# Patient Record
Sex: Male | Born: 1990 | Race: Black or African American | Hispanic: No | Marital: Single | Smoking: Never smoker
Health system: Southern US, Community
[De-identification: ages and names within clinical notes are randomized; demographics above are authoritative.]

## PROBLEM LIST (undated history)

## (undated) DIAGNOSIS — I10 Essential (primary) hypertension: Secondary | ICD-10-CM

## (undated) HISTORY — PX: ROTATOR CUFF REPAIR: SHX139

## (undated) HISTORY — DX: Essential (primary) hypertension: I10

---
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
## 2013-05-03 ENCOUNTER — Ambulatory Visit (INDEPENDENT_AMBULATORY_CARE_PROVIDER_SITE_OTHER): Payer: 59 | Admitting: Emergency Medicine

## 2013-05-03 VITALS — BP 152/88 | HR 57 | Temp 98.1°F | Resp 18 | Ht 72.0 in | Wt 257.0 lb

## 2013-05-03 DIAGNOSIS — L251 Unspecified contact dermatitis due to drugs in contact with skin: Secondary | ICD-10-CM

## 2013-05-03 MED ORDER — TRIAMCINOLONE ACETONIDE 0.1 % EX CREA: TOPICAL_CREAM | Freq: Two times a day (BID) | CUTANEOUS | Status: DC
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
## 2013-05-03 NOTE — Patient Instructions (Signed)

## 2015-12-29 ENCOUNTER — Emergency Department (HOSPITAL_BASED_OUTPATIENT_CLINIC_OR_DEPARTMENT_OTHER)
Admission: EM | Admit: 2015-12-29 | Discharge: 2015-12-29 | Disposition: A | Discharge status: Home / Self Care | Payer: 59 | Attending: Emergency Medicine | Admitting: Emergency Medicine

## 2015-12-29 ENCOUNTER — Encounter (HOSPITAL_BASED_OUTPATIENT_CLINIC_OR_DEPARTMENT_OTHER): Payer: Self-pay | Admitting: Emergency Medicine

## 2015-12-29 DIAGNOSIS — R1010 Upper abdominal pain, unspecified: Secondary | ICD-10-CM | POA: Diagnosis not present

## 2015-12-29 DIAGNOSIS — R197 Diarrhea, unspecified: Secondary | ICD-10-CM | POA: Insufficient documentation

## 2015-12-29 DIAGNOSIS — R112 Nausea with vomiting, unspecified: Secondary | ICD-10-CM | POA: Insufficient documentation

## 2015-12-29 DIAGNOSIS — Z7952 Long term (current) use of systemic steroids: Secondary | ICD-10-CM | POA: Insufficient documentation

## 2015-12-29 MED ORDER — CEFTRIAXONE SODIUM 250 MG IJ SOLR: 250.0000 mg | Freq: Once | INTRAMUSCULAR | Status: DC

## 2015-12-29 MED ORDER — AZITHROMYCIN 250 MG PO TABS: 1000.0000 mg | ORAL_TABLET | Freq: Once | ORAL | Status: DC

## 2015-12-29 MED ORDER — ONDANSETRON HCL 4 MG/2ML IJ SOLN
4.0000 mg | Freq: Once | INTRAMUSCULAR | Status: AC
  Administered 2015-12-29: 4 mg via INTRAVENOUS
  Filled 2015-12-29: qty 2

## 2015-12-29 NOTE — Discharge Instructions (Signed)
Food Poisoning °Food poisoning is an illness caused by something you ate or drank. There are over 250 known causes of food poisoning. However, many other causes are unknown. You can be treated even if the exact cause of your food poisoning is not known. In most cases, food poisoning is mild and lasts 1 to 2 days. However, some cases can be serious, especially for people with low immune systems, the elderly, children and infants, and pregnant women. °CAUSES  °Poor personal hygiene, improper cleaning of storage and preparation areas, and unclean utensils can cause infection or tainting (contamination) of foods. The causes of food poisoning are numerous. Infectious agents, such as viruses, bacteria, or parasites, can cause harm by infecting the intestine and disrupting the absorption of nutrients and water. This can cause diarrhea and lead to dehydration. Viruses are responsible for most of the food poisonings in which an agent is found. Parasites are less likely to cause food poisoning. Toxic agents, such as poisonous mushrooms, marine algae, and pesticides can also cause food poisoning. °· Viral causes of food poisoning include: °¨ Norovirus. °¨ Rotavirus. °¨ Hepatitis A. °· Bacterial causes of food poisoning include: °¨ Salmonellae. °¨ Campylobacter. °¨ Bacillus cereus. °¨ Escherichia coli (E. coli). °¨ Shigella. °¨ Listeria monocytogenes. °¨ Clostridium botulinum (botulism). °¨ Vibrio cholerae. °· Parasites that can cause food poisoning include: °¨ Giardia. °¨ Cryptosporidium. °¨ Toxoplasma. °SYMPTOMS °Symptoms may appear several hours or longer after consuming the contaminated food or drink. Symptoms may include: °· Nausea. °· Vomiting. °· Cramping. °· Diarrhea. °· Fever and chills. °· Muscle aches. °DIAGNOSIS °Your health care provider may be able to diagnose food poisoning from a list of what you have recently eaten and results from lab tests. Diagnostic tests may include an exam of the feces. °TREATMENT °In  most cases, treatment focuses on helping to relieve your symptoms and staying well hydrated. Antibiotic medicines are rarely needed. In severe cases, hospitalization may be required. °HOME CARE INSTRUCTIONS  °· Drink enough water and fluids to keep your urine clear or pale yellow. Drink small amounts of fluids frequently and increase as tolerated. °· Ask your health care provider for specific rehydration instructions. °· Avoid: °¨ Foods high in sugar. °¨ Alcohol. °¨ Carbonated drinks. °¨ Tobacco. °¨ Juice. °¨ Caffeine drinks. °¨ Extremely hot or cold fluids. °¨ Fatty, greasy foods. °¨ Too much intake of anything at one time. °¨ Dairy products until 24 to 48 hours after diarrhea stops. °· You may consume probiotics. Probiotics are active cultures of beneficial bacteria. They may lessen the amount and number of diarrheal stools in adults. Probiotics can be found in yogurt with active cultures and in supplements. °· Wash your hands well to avoid spreading the bacteria. °· Take medicines only as directed by your health care provider. Do not give your child aspirin because of the association with Reye's syndrome. °· Ask your health care provider if you should continue to take your regular prescribed and over-the-counter medicines. °PREVENTION  °· Wash your hands, food preparation surfaces, and utensils thoroughly before and after handling raw foods. °· Keep refrigerated foods below 40°F (5°C). °· Serve hot foods immediately or keep them heated above 140°F (60°C). °· Divide large volumes of food into small portions for rapid cooling in the refrigerator. Hot, bulky foods in the refrigerator can raise the temperature of other foods that have already cooled. °· Follow approved canning procedures. °· Heat canned foods thoroughly before tasting. °· When in doubt, throw it out. °· Infants, the elderly, women   who are pregnant, and people with compromised immune systems are especially susceptible to food poisoning. These people  should never consume unpasteurized cheese, unpasteurized cider, raw fish, raw seafood, or raw meat-type products. °SEEK IMMEDIATE MEDICAL CARE IF:  °· You have difficulty breathing, swallowing, talking, or moving. °· You develop blurred vision. °· You are unable to keep fluids down. °· You faint or nearly faint. °· Your eyes turn yellow. °· Vomiting or diarrhea develops or becomes persistent. °· Abdominal pain develops, increases, or localizes in one small area. °· You have a fever. °· The diarrhea becomes excessive or contains blood or mucus. °· You develop excessive weakness, dizziness, or extreme thirst. °· You have no urine for 8 hours. °MAKE SURE YOU:  °· Understand these instructions. °· Will watch your condition. °· Will get help right away if you are not doing well or get worse. °  °This information is not intended to replace advice given to you by your health care provider. Make sure you discuss any questions you have with your health care provider. °  °Document Released: 06/20/2004 Document Revised: 10/13/2014 Document Reviewed: 03/26/2015 °Elsevier Interactive Patient Education ©2016 [employer] ° °

## 2015-12-29 NOTE — ED Notes (Signed)
Pt awoke around 2am this morning nauseous and vomiting.  Diarrhea started in last hour.

## 2015-12-29 NOTE — ED Notes (Signed)
Last PO fluids approx 1 hour ago

## 2015-12-29 NOTE — ED Provider Notes (Signed)
CSN: [id number]     Arrival date & time 12/29/15  1225 History   First MD Initiated Contact with Patient 12/29/15 1236     Chief Complaint  Patient presents with  . Emesis  . Diarrhea     (Consider location/radiation/quality/duration/timing/severity/associated sxs/prior Treatment) Patient is a 25 y.o. male presenting with vomiting and diarrhea. The history is provided by the patient.  Emesis Severity:  Moderate Duration:  8 days Timing:  Constant Number of daily episodes:  10 Progression:  Unchanged Chronicity:  New Recent urination:  Normal Relieved by:  Nothing Worsened by:  Nothing tried Ineffective treatments:  None tried Associated symptoms: abdominal pain (upper) and diarrhea   Associated symptoms: no fever   Risk factors: no alcohol use   Diarrhea Severity:  Moderate Onset quality:  Gradual Number of episodes:  8-9 x today Timing:  Constant Progression:  Unchanged Relieved by:  Nothing Worsened by:  Nothing tried Ineffective treatments:  None tried Associated symptoms: abdominal pain (upper) and vomiting   Risk factors: suspect food intake (red robin red burger last night)     History reviewed. No pertinent past medical history. Past Surgical History  Procedure Laterality Date  . Rotator cuff repair     Family History  Problem Relation Age of Onset  . Diabetes Mother   . Hypertension Mother   . Diabetes Father   . Hypertension Father    Social History  Substance Use Topics  . Smoking status: Never Smoker   . Smokeless tobacco: None  . Alcohol Use: Yes    Review of Systems  Gastrointestinal: Positive for vomiting, abdominal pain (upper) and diarrhea.  All other systems reviewed and are negative.     Allergies  Review of patient's allergies indicates no known allergies.  Home Medications   Prior to Admission medications   Medication Sig Start Date End Date Taking? Authorizing Provider  triamcinolone cream (KENALOG) 0.1 % Apply topically 2  (two) times daily. 05/03/13   Carmelina Dane, MD   BP 148/78 mmHg  Pulse 114  Temp(Src) 98.1 F (36.7 C) (Oral)  Resp 22  Ht  (1.854 m)  Wt 245 lb (111.131 kg)  BMI 32.33 kg/m2  SpO2 100% Physical Exam  Constitutional: He is oriented to person, place, and time. He appears well-developed and well-nourished. No distress.  HENT:  Head: Normocephalic and atraumatic.  Eyes: Conjunctivae are normal.  Neck: Neck supple. No tracheal deviation present.  Cardiovascular: Normal rate, regular rhythm and normal heart sounds.   Pulmonary/Chest: Effort normal and breath sounds normal. No respiratory distress.  Abdominal: Soft. He exhibits no distension. There is no tenderness. There is no rebound and no guarding.  Neurological: He is alert and oriented to person, place, and time.  Skin: Skin is warm and dry.  Psychiatric: He has a normal mood and affect.  Vitals reviewed.   ED Course  Procedures (including critical care time) Labs Review Labs Reviewed - No data to display  Imaging Review No results found. I have personally reviewed and evaluated these images and lab results as part of my medical decision-making.   EKG Interpretation None      MDM   Final diagnoses:  Nausea vomiting and diarrhea    Patient presents with symptoms c/w a viral gastroenteritis versus food-borne illness (vomiting, diarrhea) for 1 day. No fevers. Patient appears well. No signs of toxicity, patient is interactive. Not in distress. No signs of clinical dehydration. Doubt appendicitis, and no evidence of any other illness.  No imaging or labs indicated with likely self-limited illness. Discussed symptomatic treatment and they will follow closely with their PCP.     Lyndal Pulleyaniel Donnald Tabar, MD 12/30/15 [phone]

## 2015-12-29 NOTE — ED Notes (Signed)
Presents with N/V and abd cramping since approx 0200hrs this am

## 2015-12-29 NOTE — ED Notes (Signed)
[facility]

## 2016-12-16 ENCOUNTER — Encounter (HOSPITAL_COMMUNITY): Payer: Self-pay

## 2016-12-16 ENCOUNTER — Emergency Department (HOSPITAL_COMMUNITY)
Admission: EM | Admit: 2016-12-16 | Discharge: 2016-12-16 | Disposition: A | Discharge status: Home / Self Care | Payer: Worker's Compensation | Attending: Emergency Medicine | Admitting: Emergency Medicine

## 2016-12-16 DIAGNOSIS — Y999 Unspecified external cause status: Secondary | ICD-10-CM | POA: Insufficient documentation

## 2016-12-16 DIAGNOSIS — M545 Low back pain, unspecified: Secondary | ICD-10-CM

## 2016-12-16 DIAGNOSIS — Y92410 Unspecified street and highway as the place of occurrence of the external cause: Secondary | ICD-10-CM | POA: Insufficient documentation

## 2016-12-16 DIAGNOSIS — Y9389 Activity, other specified: Secondary | ICD-10-CM | POA: Insufficient documentation

## 2016-12-16 DIAGNOSIS — M549 Dorsalgia, unspecified: Secondary | ICD-10-CM | POA: Diagnosis not present

## 2016-12-16 DIAGNOSIS — M542 Cervicalgia: Secondary | ICD-10-CM

## 2016-12-16 MED ORDER — METHOCARBAMOL 500 MG PO TABS: 500.0000 mg | ORAL_TABLET | Freq: Every evening | ORAL | 0 refills | Status: DC | PRN

## 2016-12-16 NOTE — ED Notes (Signed)
Declined W/C at D/C and was escorted to lobby by RN. 
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
## 2016-12-16 NOTE — Discharge Instructions (Signed)
Take Ibuprofen three times daily for the next week. Take this medicine with food. °Take muscle relaxer at bedtime to help you sleep. This medicine makes you drowsy so do not take before driving or work °Use a heating pad for sore muscles - use for 20 minutes several times a day °Return for worsening symptoms ° °
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
## 2016-12-23 ENCOUNTER — Ambulatory Visit: Payer: Self-pay

## 2016-12-23 ENCOUNTER — Other Ambulatory Visit: Payer: Self-pay | Admitting: Occupational Medicine

## 2016-12-23 DIAGNOSIS — M542 Cervicalgia: Secondary | ICD-10-CM

## 2016-12-23 DIAGNOSIS — M545 Low back pain: Secondary | ICD-10-CM
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
## 2018-12-23 ENCOUNTER — Other Ambulatory Visit: Payer: Self-pay

## 2018-12-23 ENCOUNTER — Encounter: Payer: Self-pay | Admitting: Podiatry

## 2018-12-23 ENCOUNTER — Ambulatory Visit: Payer: BC Managed Care – PPO | Admitting: Podiatry

## 2018-12-23 VITALS — BP 167/106

## 2018-12-23 DIAGNOSIS — L600 Ingrowing nail: Secondary | ICD-10-CM

## 2018-12-23 MED ORDER — NEOMYCIN-POLYMYXIN-HC 3.5-10000-1 OT SOLN: OTIC | 0 refills | Status: DC
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
## 2018-12-23 NOTE — Patient Instructions (Signed)

## 2019-01-23 ENCOUNTER — Encounter (HOSPITAL_COMMUNITY): Payer: Self-pay | Admitting: Urgent Care

## 2019-01-23 ENCOUNTER — Other Ambulatory Visit: Payer: Self-pay

## 2019-01-23 ENCOUNTER — Ambulatory Visit (HOSPITAL_COMMUNITY)
Admission: EM | Admit: 2019-01-23 | Discharge: 2019-01-23 | Disposition: A | Discharge status: Home / Self Care | Payer: BC Managed Care – PPO | Attending: Urgent Care | Admitting: Urgent Care

## 2019-01-23 DIAGNOSIS — M545 Low back pain, unspecified: Secondary | ICD-10-CM

## 2019-01-23 DIAGNOSIS — S161XXA Strain of muscle, fascia and tendon at neck level, initial encounter: Secondary | ICD-10-CM | POA: Diagnosis not present

## 2019-01-23 DIAGNOSIS — M542 Cervicalgia: Secondary | ICD-10-CM | POA: Diagnosis not present

## 2019-01-23 MED ORDER — CYCLOBENZAPRINE HCL 5 MG PO TABS: 5.0000 mg | ORAL_TABLET | Freq: Three times a day (TID) | ORAL | 1 refills | Status: DC | PRN

## 2019-01-23 MED ORDER — MELOXICAM 15 MG PO TABS: 15.0000 mg | ORAL_TABLET | Freq: Every day | ORAL | 0 refills | Status: DC
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
## 2019-01-23 NOTE — Discharge Instructions (Signed)
Take 2 tablets of your amlodipine once daily to help with your blood pressure especially while taking the medications that I will prescribe. Use meloxicam at a 1/2 tablet once daily to start with but if your neck and low back pain persist, go ahead and take the full tablet. Use Flexeril (cyclobenzaprine) as a muscle relaxant, 3 times a day as you need it. Or if it makes you sleepy, then take 1-2 tablets at bedtime. If your employer has restrictions for medications that can cause drowsiness, then only take Flexeril at bed time.
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
## 2020-06-21 ENCOUNTER — Other Ambulatory Visit: Payer: Self-pay

## 2020-06-21 ENCOUNTER — Emergency Department (HOSPITAL_COMMUNITY)
Admission: EM | Admit: 2020-06-21 | Discharge: 2020-06-21 | Disposition: A | Discharge status: Home / Self Care | Payer: No Typology Code available for payment source | Attending: Emergency Medicine | Admitting: Emergency Medicine

## 2020-06-21 ENCOUNTER — Encounter (HOSPITAL_COMMUNITY): Payer: Self-pay

## 2020-06-21 DIAGNOSIS — Z79899 Other long term (current) drug therapy: Secondary | ICD-10-CM | POA: Insufficient documentation

## 2020-06-21 DIAGNOSIS — M25512 Pain in left shoulder: Secondary | ICD-10-CM | POA: Insufficient documentation

## 2020-06-21 DIAGNOSIS — M25511 Pain in right shoulder: Secondary | ICD-10-CM | POA: Diagnosis not present

## 2020-06-21 DIAGNOSIS — M62830 Muscle spasm of back: Secondary | ICD-10-CM | POA: Insufficient documentation

## 2020-06-21 DIAGNOSIS — M62838 Other muscle spasm: Secondary | ICD-10-CM

## 2020-06-21 DIAGNOSIS — I10 Essential (primary) hypertension: Secondary | ICD-10-CM | POA: Insufficient documentation

## 2020-06-21 DIAGNOSIS — M545 Low back pain: Secondary | ICD-10-CM | POA: Diagnosis present

## 2020-06-21 DIAGNOSIS — M542 Cervicalgia: Secondary | ICD-10-CM | POA: Diagnosis not present

## 2020-06-21 MED ORDER — IBUPROFEN 600 MG PO TABS
600.0000 mg | ORAL_TABLET | Freq: Four times a day (QID) | ORAL | 0 refills | Status: AC | PRN
Start: 2020-06-21 — End: ?

## 2020-06-21 MED ORDER — CYCLOBENZAPRINE HCL 10 MG PO TABS: 10.0000 mg | ORAL_TABLET | Freq: Two times a day (BID) | ORAL | 0 refills | Status: DC | PRN

## 2020-06-21 NOTE — Discharge Instructions (Signed)
Please follow up with your doctor's office on Monday.
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
## 2020-09-02 ENCOUNTER — Encounter: Payer: Self-pay | Admitting: Cardiovascular Disease

## 2020-09-02 NOTE — Progress Notes (Signed)
Cardiology Office Note:   Date:  09/04/2020  NAME:  Kurt Keller    MRN: [id number] DOB:  [birth year]   PCP:  Kirby Funk, MD  Cardiologist:  No primary care provider on file.  Electrophysiologist:  None   Referring MD: Kirby Funk, MD   Chief Complaint  Patient presents with  . Hypertension   History of Present Illness:   Kurt Keller is a 29 y.o. male with a hx of hypertension who is being seen today for the evaluation of abnormal EKG at the request of Kirby Funk, MD.  Reports that he has been treated for hypertension by his primary care physician.  He takes amlodipine and valsartan.  Blood pressure 122/82.  He is obese with a BMI of 37.  He is starting exercise more work with a Psychologist, educational.  He is also working on his diet as well.  His EKG today demonstrates normal sinus rhythm with inferolateral T wave inversions.  He reports that he had an echocardiogram that was abnormal at his primary care physician's office.  I did review the report of the echocardiogram.  This shows normal chamber size and dimensions.  He had normal left ventricular function.  They report possible left ventricular diastolic dysfunction.  On the report there is no mention of mitral valve tissue Dopplers.  He had a normal E/A ratio given his age.  I do not understand what possible diastolic dysfunction means from an accredited echocardiogram laboratory.  They should have made a definitive statement on this.  Despite this, he reports no symptoms of chest pain or shortness of breath.  He has no symptoms of heart failure.  He denies any chest pain or shortness of breath with his current level of activity.  He completes 20 to 30 minutes of physical activity daily without any limitations.  He works as a Corporate treasurer.  He does not smoke.  He rarely drinks alcohol.  No drug use reported.  There is no family history of sudden cardiac death.  He has had heart disease in his grandparents in their older years.  He  reports no major symptoms today in office.  Past Medical History: Past Medical History:  Diagnosis Date  . Hypertension    Past Surgical History: Past Surgical History:  Procedure Laterality Date  . ROTATOR CUFF REPAIR      Current Medications: Current Meds  Medication Sig  . amLODipine-valsartan (EXFORGE) 10-320 MG tablet Take 1 tablet by mouth daily.  . [DISCONTINUED] amLODipine (NORVASC) 5 MG tablet TK 1 T PO QD  . [DISCONTINUED] cyclobenzaprine (FLEXERIL) 10 MG tablet Take 1 tablet (10 mg total) by mouth 2 (two) times daily as needed for up to 15 doses for muscle spasms.  . [DISCONTINUED] cyclobenzaprine (FLEXERIL) 5 MG tablet Take 1 tablet (5 mg total) by mouth 3 (three) times daily as needed for muscle spasms.  . [DISCONTINUED] meloxicam (MOBIC) 15 MG tablet Take 1 tablet (15 mg total) by mouth daily.  . [DISCONTINUED] triamcinolone cream (KENALOG) 0.1 % Apply topically 2 (two) times daily.     Allergies:    Patient has no known allergies.   Social History: Social History   Socioeconomic History  . Marital status: Single    Spouse name: Not on file  . Number of children: 1  . Years of education: Not on file  . Highest education level: Not on file  Occupational History  . 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 Smoking status: Never Smoker  .  Smokeless tobacco: Never Used  Vaping Use  . Vaping Use: Never used  Substance and Sexual Activity  . Alcohol use: Yes  . Drug use: No  . Sexual activity: Not on file  Other Topics Concern  . Not on file  Social History Narrative  . Not on file   Social Determinants of Health   Financial Resource Strain:   . Difficulty of Paying Living Expenses: Not on file  Food Insecurity:   . Worried About Programme researcher, broadcasting/film/video in the Last Year: Not on file  . Ran Out of Food in the Last Year: Not on file  Transportation Needs:   . Lack of Transportation (Medical): Not on file  . Lack of Transportation (Non-Medical):  Not on file  Physical Activity:   . Days of Exercise per Week: Not on file  . Minutes of Exercise per Session: Not on file  Stress:   . Feeling of Stress : Not on file  Social Connections:   . Frequency of Communication with Friends and Family: Not on file  . Frequency of Social Gatherings with Friends and Family: Not on file  . Attends Religious Services: Not on file  . Active Member of Clubs or Organizations: Not on file  . Attends Banker Meetings: Not on file  . Marital Status: Not on file    Family History: The patient's family history includes Diabetes in his father and mother; Heart attack in his maternal grandfather; Hypertension in his father and mother.  ROS:   All other ROS reviewed and negative. Pertinent positives noted in the HPI.     EKGs/Labs/Other Studies Reviewed:   The following studies were personally reviewed by me today:  EKG:  EKG is ordered today.  The ekg ordered today demonstrates sinus bradycardia, heart rate 54, inferolateral T wave inversions noted, and was personally reviewed by me.   Recent Labs: No results found for requested labs within last 8760 hours.   Recent Lipid Panel No results found for: CHOL, TRIG, HDL, CHOLHDL, VLDL, LDLCALC, LDLDIRECT  Physical Exam:   VS:  BP 124/82   Pulse (!) 54   Ht 6\' 1"  (1.854 m)   Wt 278 lb (126.1 kg)   SpO2 99%   BMI 36.68 kg/m    Wt Readings from Last 3 Encounters:  09/04/20 278 lb (126.1 kg)  06/21/20 270 lb (122.5 kg)  12/29/15 245 lb (111.1 kg)    General: Well nourished, well developed, in no acute distress Heart: Atraumatic, normal size  Eyes: PEERLA, EOMI  Neck: Supple, no JVD Endocrine: No thryomegaly Cardiac: Normal S1, S2; RRR; no murmurs, rubs, or gallops Lungs: Clear to auscultation bilaterally, no wheezing, rhonchi or rales  Abd: Soft, nontender, no hepatomegaly  Ext: No edema, pulses 2+ Musculoskeletal: No deformities, BUE and BLE strength normal and equal Skin: Warm  and dry, no rashes   Neuro: Alert and oriented to person, place, time, and situation, CNII-XII grossly intact, no focal deficits  Psych: Normal mood and affect   ASSESSMENT:   Kurt Keller is a 29 y.o. male who presents for the following: 1. Nonspecific abnormal electrocardiogram (ECG) (EKG)   2. Primary hypertension   3. Obesity (BMI 30-39.9)     PLAN:   1. Nonspecific abnormal electrocardiogram (ECG) (EKG) 2. Primary hypertension 3. Obesity (BMI 30-39.9) -History of hypertension.  Well-controlled on amlodipine and losartan.  He is obese with a BMI of 38.  I encouraged diet and exercise.  His EKG today  demonstrates normal sinus rhythm with inferolateral T wave inversions.  He had an echocardiogram obtained through his primary care physician and the report results showed normal left ventricular function with normal LV thickness.  They mention possible diastolic dysfunction.  There is no mention of tissue Dopplers on the mitral annulus.  He had a normal E/A ratio.  I highly suspect he has normal diastolic function given that he has no symptoms.  It would be very unusual for a young man like him to have diastolic dysfunction.  It is a bit odd that they did not comment on definitive diastolic function.  This is a part of any accredited echocardiographic laboratory.  Given his lack of symptoms and normal LV function I see no need for further testing.  His blood pressure is well controlled.  I encouraged adequate diet and exercise moving forward.  He will see [place] as needed.  Disposition: Return if symptoms worsen or fail to improve.  Medication Adjustments/Labs and Tests Ordered: Current medicines are reviewed at length with the patient today.  Concerns regarding medicines are outlined above.  No orders of the defined types were placed in this encounter.  No orders of the defined types were placed in this encounter.   Patient Instructions  Medication Instructions:  No change   Lab  Work: None ordered   Testing/Procedures: None ordered   Follow-Up: At [facility], you and your health needs are our priority.  As part of our continuing mission to provide you with exceptional heart care, we have created designated Provider Care Teams.  These Care Teams include your primary Cardiologist (physician) and Advanced Practice Providers (APPs -  Physician Assistants and Nurse Practitioners) who all work together to provide you with the care you need, when you need it.  We recommend signing up for the patient portal called "MyChart".  Sign up information is provided on this After Visit Summary.  MyChart is used to connect with patients for Virtual Visits (Telemedicine).  Patients are able to view lab/test results, encounter notes, upcoming appointments, etc.  Non-urgent messages can be sent to your provider as well.   To learn more about what you can do with MyChart, go to [email or web address].    Your next appointment:  As Needed   The format for your next appointment: Office    Provider: Dr.O'Neal       Signed, Lenna Gilford. Flora Lipps, MD [facility]  [address] [place], [place] [address] [phone]  09/04/2020 11:02 AM

## 2020-09-04 ENCOUNTER — Ambulatory Visit (INDEPENDENT_AMBULATORY_CARE_PROVIDER_SITE_OTHER): Payer: BC Managed Care – PPO | Admitting: Cardiovascular Disease

## 2020-09-04 ENCOUNTER — Other Ambulatory Visit: Payer: Self-pay

## 2020-09-04 ENCOUNTER — Encounter: Payer: Self-pay | Admitting: Cardiovascular Disease

## 2020-09-04 VITALS — BP 124/82 | HR 54 | Ht 73.0 in | Wt 278.0 lb

## 2020-09-04 DIAGNOSIS — R9431 Abnormal electrocardiogram [ECG] [EKG]: Secondary | ICD-10-CM

## 2020-09-04 DIAGNOSIS — E669 Obesity, unspecified: Secondary | ICD-10-CM

## 2020-09-04 DIAGNOSIS — I10 Essential (primary) hypertension: Secondary | ICD-10-CM

## 2020-09-04 NOTE — Patient Instructions (Signed)
Medication Instructions:  No change   Lab Work: None ordered  Testing/Procedures: None ordered   Follow-Up: At [facility], you and your health needs are our priority.  As part of our continuing mission to provide you with exceptional heart care, we have created designated Provider Care Teams.  These Care Teams include your primary Cardiologist (physician) and Advanced Practice Providers (APPs -  Physician Assistants and Nurse Practitioners) who all work together to provide you with the care you need, when you need it.  We recommend signing up for the patient portal called "MyChart".  Sign up information is provided on this After Visit Summary.  MyChart is used to connect with patients for Virtual Visits (Telemedicine).  Patients are able to view lab/test results, encounter notes, upcoming appointments, etc.  Non-urgent messages can be sent to your provider as well.   To learn more about what you can do with MyChart, go to [email or web address].    Your next appointment:  As Needed   The format for your next appointment: Office    Provider:  Dr.O'Neal   

## 2021-02-18 ENCOUNTER — Ambulatory Visit (INDEPENDENT_AMBULATORY_CARE_PROVIDER_SITE_OTHER): Payer: BC Managed Care – PPO

## 2021-02-18 ENCOUNTER — Other Ambulatory Visit: Payer: Self-pay

## 2021-02-18 ENCOUNTER — Ambulatory Visit (INDEPENDENT_AMBULATORY_CARE_PROVIDER_SITE_OTHER): Payer: BC Managed Care – PPO | Admitting: Podiatry

## 2021-02-18 ENCOUNTER — Encounter: Payer: Self-pay | Admitting: Podiatry

## 2021-02-18 DIAGNOSIS — M778 Other enthesopathies, not elsewhere classified: Secondary | ICD-10-CM | POA: Diagnosis not present

## 2021-02-18 DIAGNOSIS — M109 Gout, unspecified: Secondary | ICD-10-CM

## 2021-02-18 MED ORDER — TRIAMCINOLONE ACETONIDE 10 MG/ML IJ SUSP
10.0000 mg | Freq: Once | INTRAMUSCULAR | Status: AC
  Administered 2021-02-18: 10 mg

## 2021-02-18 NOTE — Patient Instructions (Signed)
Gout  Gout is painful swelling of your joints. Gout is a type of arthritis. It is caused by having too much uric acid in your body. Uric acid is a chemical that is made when your body breaks down substances called purines. If your body has too much uric acid, sharp crystals can form and build up in your joints. This causes pain and swelling. Gout attacks can happen quickly and be very painful (acute gout). Over time, the attacks can affect more joints and happen more often (chronic gout). What are the causes?  Too much uric acid in your blood. This can happen because: ? Your kidneys do not remove enough uric acid from your blood. ? Your body makes too much uric acid. ? You eat too many foods that are high in purines. These foods include organ meats, some seafood, and beer.  Trauma or stress. What increases the risk?  Having a family history of gout.  Being male and middle-aged.  Being male and having gone through menopause.  Being very overweight (obese).  Drinking alcohol, especially beer.  Not having enough water in the body (being dehydrated).  Losing weight too quickly.  Having an organ transplant.  Having lead poisoning.  Taking certain medicines.  Having kidney disease.  Having a skin condition called psoriasis. What are the signs or symptoms? An attack of acute gout usually happens in just one joint. The most common place is the big toe. Attacks often start at night. Other joints that may be affected include joints of the feet, ankle, knee, fingers, wrist, or elbow. Symptoms of an attack may include:  Very bad pain.  Warmth.  Swelling.  Stiffness.  Shiny, red, or purple skin.  Tenderness. The affected joint may be very painful to touch.  Chills and fever. Chronic gout may cause symptoms more often. More joints may be involved. You may also have white or yellow lumps (tophi) on your hands or feet or in other areas near your joints.   How is this  treated?  Treatment for this condition has two phases: treating an acute attack and preventing future attacks.  Acute gout treatment may include: ? NSAIDs. ? Steroids. These are taken by mouth or injected into a joint. ? Colchicine. This medicine relieves pain and swelling. It can be given by mouth or through an IV tube.  Preventive treatment may include: ? Taking small doses of NSAIDs or colchicine daily. ? Using a medicine that reduces uric acid levels in your blood. ? Making changes to your diet. You may need to see a food expert (dietitian) about what to eat and drink to prevent gout. Follow these instructions at home: During a gout attack  If told, put ice on the painful area: ? Put ice in a plastic bag. ? Place a towel between your skin and the bag. ? Leave the ice on for 20 minutes, 2-3 times a day.  Raise (elevate) the painful joint above the level of your heart as often as you can.  Rest the joint as much as possible. If the joint is in your leg, you may be given crutches.  Follow instructions from your doctor about what you cannot eat or drink.   Avoiding future gout attacks  Eat a low-purine diet. Avoid foods and drinks such as: ? Liver. ? Kidney. ? Anchovies. ? Asparagus. ? Herring. ? Mushrooms. ? Mussels. ? Beer.  Stay at a healthy weight. If you want to lose weight, talk with your doctor. Do   not lose weight too fast.  Start or continue an exercise plan as told by your doctor. Eating and drinking  Drink enough fluids to keep your pee (urine) pale yellow.  If you drink alcohol: ? Limit how much you use to:  0-1 drink a day for women.  0-2 drinks a day for men. ? Be aware of how much alcohol is in your drink. In the [place] one drink equals one 12 oz bottle of beer (355 mL), one 5 oz glass of wine (148 mL), or one 1 oz glass of hard liquor (44 mL). General instructions  Take over-the-counter and prescription medicines only as told by your doctor.  Do  not drive or use heavy machinery while taking prescription pain medicine.  Return to your normal activities as told by your doctor. Ask your doctor what activities are safe for you.  Keep all follow-up visits as told by your doctor. This is important. Contact a doctor if:  You have another gout attack.  You still have symptoms of a gout attack after 10 days of treatment.  You have problems (side effects) because of your medicines.  You have chills or a fever.  You have burning pain when you pee (urinate).  You have pain in your lower back or belly. Get help right away if:  You have very bad pain.  Your pain cannot be controlled.  You cannot pee. Summary  Gout is painful swelling of the joints.  The most common site of pain is the big toe, but it can affect other joints.  Medicines and avoiding some foods can help to prevent and treat gout attacks. This information is not intended to replace advice given to you by your health care provider. Make sure you discuss any questions you have with your health care provider. Document Revised: 04/14/2018 Document Reviewed: 04/14/2018 [employer] Patient Education  2021 [employer]  

## 2021-02-18 NOTE — Progress Notes (Signed)
Subjective:   Patient ID: Kurt Keller, male   DOB: 30 y.o.   MRN: [id number]   HPI Patient's not been seen for several years presents with a fluid buildup around the first MPJ left of approximate 2 weeks duration stating he took steroids for a period of time which were ineffective.  States is been very sore and he does have a family history of gout   ROS      Objective:  Physical Exam  Neurovascular status intact with patient found to have inflammation and fluid buildup around the first MPJ left with pain and reduced range of motion     Assessment:  Possibility for hallux limitus type condition with inflammatory capsulitis versus gout or other acute inflammatory condition     Plan:  H&P x-rays reviewed and at this point sterile prep injected the first MPJ 3 mg Kenalog 5 mg Xylocaine and I then went ahead and I discussed with him gout and I gave him instructions on gout educated him on it and discussed the possibilities for medication in the future if further attacks were to occur  X-rays indicate there is moderate elevation of the first metatarsal segment left no indications of arthritis of the joint surface
